# Patient Record
Sex: Male | Born: 2000 | Race: White | Hispanic: No | Marital: Single | Smoking: Never smoker
Health system: Southern US, Community
[De-identification: ages and names within clinical notes are randomized; demographics above are authoritative.]

## PROBLEM LIST (undated history)

## (undated) DIAGNOSIS — E663 Overweight: Secondary | ICD-10-CM

## (undated) DIAGNOSIS — M93221 Osteochondritis dissecans, right elbow: Secondary | ICD-10-CM

## (undated) DIAGNOSIS — J309 Allergic rhinitis, unspecified: Secondary | ICD-10-CM

## (undated) DIAGNOSIS — R7309 Other abnormal glucose: Secondary | ICD-10-CM

## (undated) DIAGNOSIS — H1010 Acute atopic conjunctivitis, unspecified eye: Secondary | ICD-10-CM

## (undated) HISTORY — DX: Allergic rhinitis, unspecified: J30.9

## (undated) HISTORY — DX: Osteochondritis dissecans, right elbow: M93.221

## (undated) HISTORY — DX: Overweight: E66.3

## (undated) HISTORY — DX: Other abnormal glucose: R73.09

## (undated) HISTORY — DX: Acute atopic conjunctivitis, unspecified eye: H10.10

---
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
## 2022-05-01 ENCOUNTER — Encounter: Payer: Self-pay | Admitting: Radiology

## 2022-05-15 DIAGNOSIS — Z0000 Encounter for general adult medical examination without abnormal findings: Secondary | ICD-10-CM | POA: Diagnosis not present

## 2022-05-15 DIAGNOSIS — Z68.41 Body mass index (BMI) pediatric, 5th percentile to less than 85th percentile for age: Secondary | ICD-10-CM | POA: Diagnosis not present

## 2022-05-15 DIAGNOSIS — M93221 Osteochondritis dissecans, right elbow: Secondary | ICD-10-CM | POA: Diagnosis not present

## 2022-06-30 IMAGING — MR MR ELBOW*R* W/O CM
6 series · 40 of 40 positions shown · non-contrast
Comparison: X-ray 08/13/2020

CLINICAL DATA: 19-year-old baseball pitcher with elbow pain for
months. Elbow pain, chronic, collateral ligament tear suspected,
nondiagnostic xray

EXAM:
MRI OF THE RIGHT ELBOW WITHOUT CONTRAST
TECHNIQUE: Multiplanar, multisequence MR imaging of the elbow was performed. No
intravenous contrast was administered.

[Series 3: T1 · axial · right · 3.0mm · 0.51mm/px · z∈[-23,+87]mm · 6 of 29 slices shown (1 of 2)]
[im 1/29]
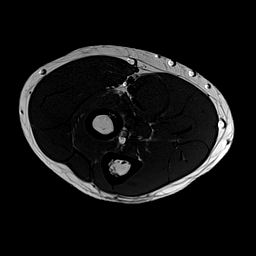
[im 6/29]
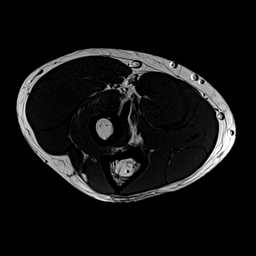
[im 12/29]
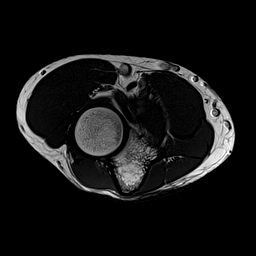
[im 17/29]
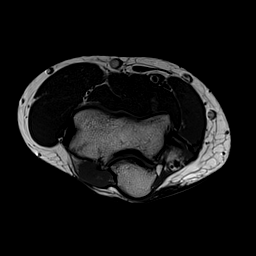
[im 23/29]
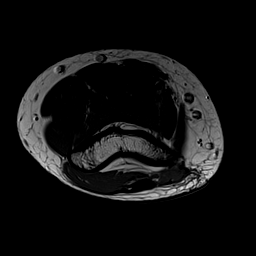
[im 29/29]
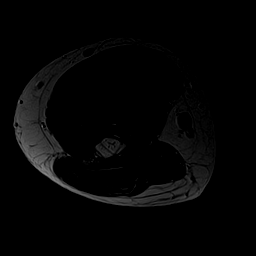

[Series 4: T2 fat-sat · axial · right · 3.0mm · 0.47mm/px · z∈[-23,+88]mm · 7 of 29 slices shown (1 of 4)]
[im 1/29]
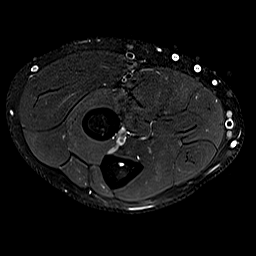
[im 5/29]
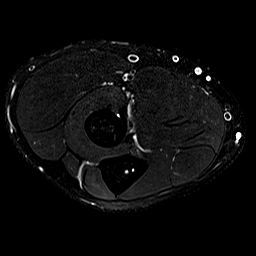
[im 10/29]
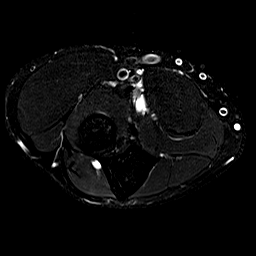
[im 15/29]
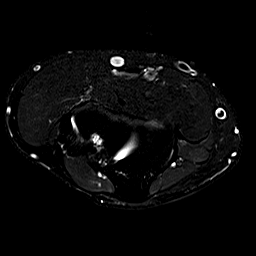
[im 19/29]
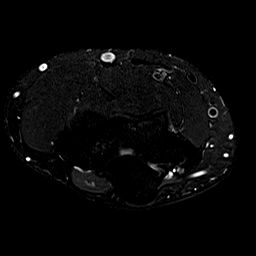
[im 24/29]
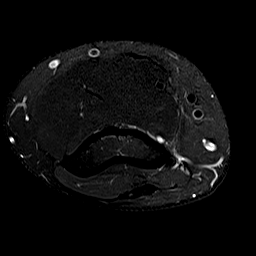
[im 29/29]
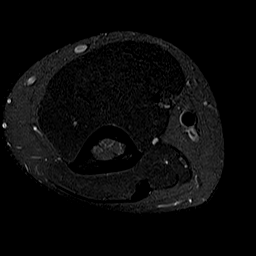

[Series 5: T2 fat-sat · coronal · right · 3.0mm · 0.55mm/px · 6 of 23 slices shown (2 of 4)]
[im 1/23]
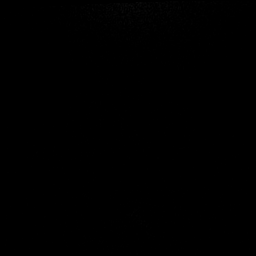
[im 5/23]
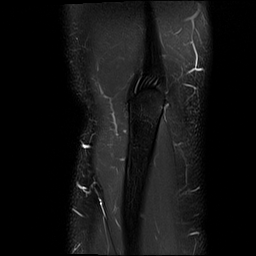
[im 9/23]
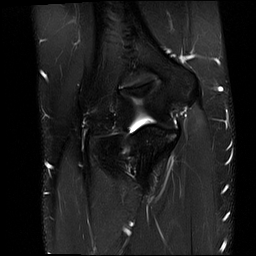
[im 14/23]
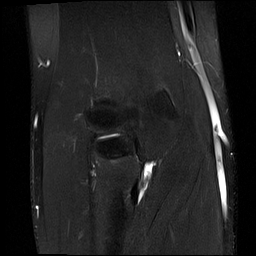
[im 18/23]
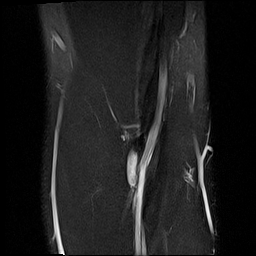
[im 23/23]
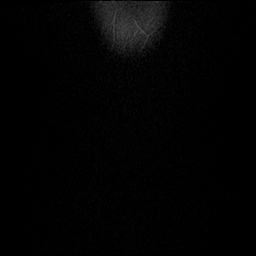

[Series 6: T1 · coronal · right · 3.0mm · 0.55mm/px · 6 of 23 slices shown (2 of 2)]
[im 1/23]
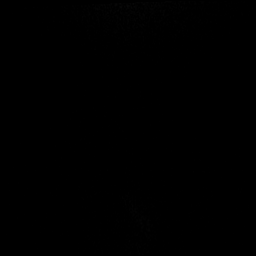
[im 5/23]
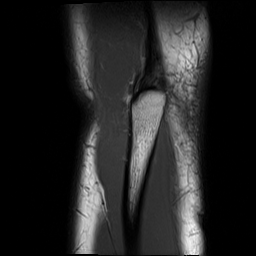
[im 9/23]
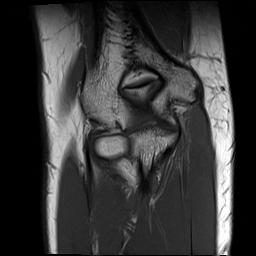
[im 14/23]
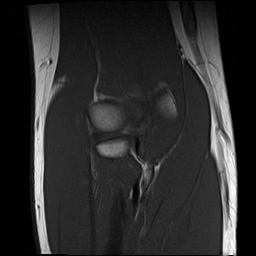
[im 18/23]
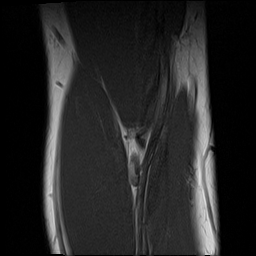
[im 23/23]
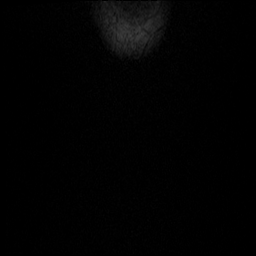

[Series 7: T2 fat-sat · sagittal · right · 3.0mm · 0.55mm/px · 7 of 30 slices shown (3 of 4)]
[im 1/30]
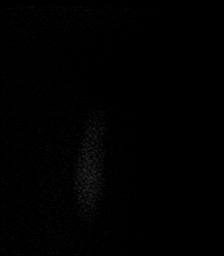
[im 5/30]
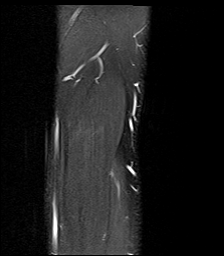
[im 10/30]
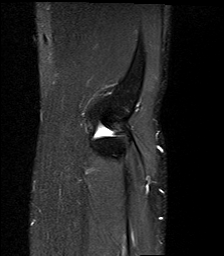
[im 15/30]
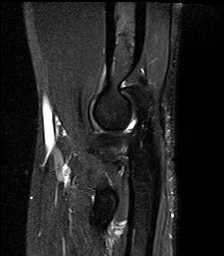
[im 20/30]
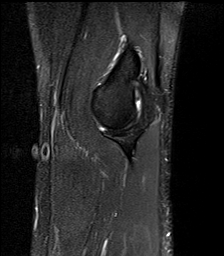
[im 25/30]
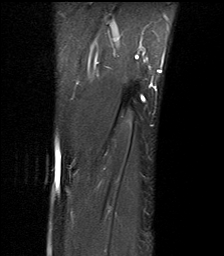
[im 30/30]
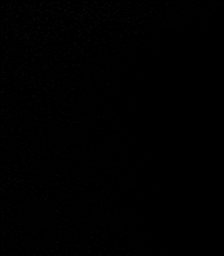

[Series 8: T2 fat-sat · sagittal · right · 3.0mm · 0.55mm/px · 8 of 31 slices shown (4 of 4)]
[im 1/31]
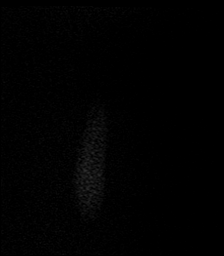
[im 5/31]
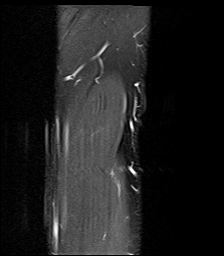
[im 9/31]
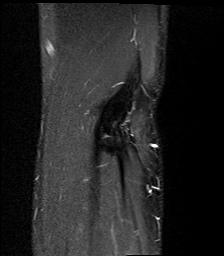
[im 13/31]
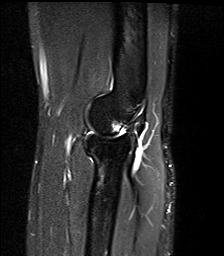
[im 18/31]
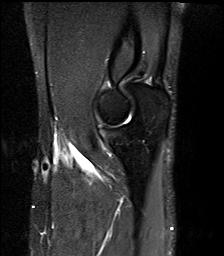
[im 22/31]
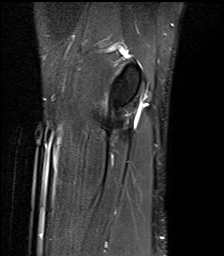
[im 26/31]
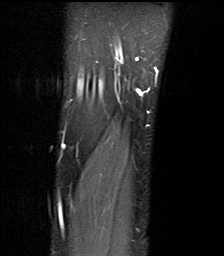
[im 31/31]
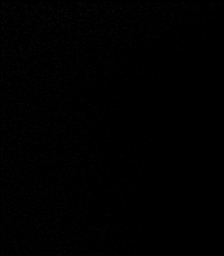

[40 of 40 positions shown; findings below may reference images not displayed]

FINDINGS: TENDONS

Common forearm flexor origin: Intact without tendinosis or tear.

Common forearm extensor origin: Intact without tendinosis or tear.

Biceps: Intact without tendinosis or tear. No bicipitoradialis
bursal fluid.

Triceps: Intact without tendinosis or tear.

LIGAMENTS

Medial stabilizers: Ulnar collateral ligament appears intact without
evidence to suggest tear on non arthrographic imaging.

Lateral stabilizers: Intact lateral stabilizers including the radial
collateral ligament, lateral ulnar collateral ligament, and annular
ligament.

Cartilage: Focal area subchondral cystic change along the posterior
capitellum measuring 6 x 4 mm without evidence of a overlying
cartilage defect. No undercutting fluid signal to suggest a unstable
fragment. No surrounding bone marrow edema. Cartilage otherwise
within normal limits.

Joint: No joint effusion.  No intra-articular loose body.

Cubital tunnel: Cubital tunnel and ulnar nerve are within normal
limits.

Bones: Subchondral cyst formation at the posterior capitellum.
Osseous structures are otherwise within normal limits. No acute
fracture. No dislocation. No suspicious bone lesion.

Other: Normal muscle bulk and signal intensity. No soft tissue edema
or fluid collection.
IMPRESSION: 1. Focal area subchondral cystic change along the posterior
capitellum measuring 6 x 4 mm, likely osteochondral lesion. No
undercutting fluid signal to suggest an unstable fragment.
2. Otherwise unremarkable MRI of the right elbow. Specifically, no
evidence of ulnar collateral ligament tear.

## 2022-11-13 ENCOUNTER — Encounter: Payer: Self-pay | Admitting: *Deleted

## 2023-01-28 DIAGNOSIS — Z6826 Body mass index (BMI) 26.0-26.9, adult: Secondary | ICD-10-CM | POA: Diagnosis not present

## 2023-01-28 DIAGNOSIS — R0982 Postnasal drip: Secondary | ICD-10-CM | POA: Diagnosis not present

## 2023-01-28 DIAGNOSIS — R059 Cough, unspecified: Secondary | ICD-10-CM | POA: Diagnosis not present

## 2023-11-20 ENCOUNTER — Encounter: Payer: Self-pay | Admitting: *Deleted
